# Patient Record
Sex: Female | Born: 1995 | Race: White | Hispanic: No | Marital: Married | Smoking: Current some day smoker
Health system: Southern US, Community
[De-identification: ages and names within clinical notes are randomized; demographics above are authoritative.]

## PROBLEM LIST (undated history)

## (undated) ENCOUNTER — Inpatient Hospital Stay (HOSPITAL_COMMUNITY): Payer: Self-pay

## (undated) DIAGNOSIS — S199XXA Unspecified injury of neck, initial encounter: Secondary | ICD-10-CM

## (undated) DIAGNOSIS — Z789 Other specified health status: Secondary | ICD-10-CM

## (undated) HISTORY — PX: WISDOM TOOTH EXTRACTION: SHX21

---

## 2019-05-22 ENCOUNTER — Encounter (HOSPITAL_BASED_OUTPATIENT_CLINIC_OR_DEPARTMENT_OTHER): Payer: Self-pay | Admitting: *Deleted

## 2019-05-22 ENCOUNTER — Emergency Department (HOSPITAL_BASED_OUTPATIENT_CLINIC_OR_DEPARTMENT_OTHER)
Admission: EM | Admit: 2019-05-22 | Discharge: 2019-05-22 | Disposition: A | Discharge status: Home / Self Care | Payer: Worker's Compensation | Attending: Emergency Medicine | Admitting: Emergency Medicine

## 2019-05-22 ENCOUNTER — Emergency Department (HOSPITAL_BASED_OUTPATIENT_CLINIC_OR_DEPARTMENT_OTHER): Payer: Worker's Compensation

## 2019-05-22 ENCOUNTER — Other Ambulatory Visit: Payer: Self-pay

## 2019-05-22 DIAGNOSIS — W540XXA Bitten by dog, initial encounter: Secondary | ICD-10-CM | POA: Insufficient documentation

## 2019-05-22 DIAGNOSIS — Z23 Encounter for immunization: Secondary | ICD-10-CM | POA: Diagnosis not present

## 2019-05-22 DIAGNOSIS — Y939 Activity, unspecified: Secondary | ICD-10-CM | POA: Insufficient documentation

## 2019-05-22 DIAGNOSIS — S6010XA Contusion of unspecified finger with damage to nail, initial encounter: Secondary | ICD-10-CM

## 2019-05-22 DIAGNOSIS — Y929 Unspecified place or not applicable: Secondary | ICD-10-CM | POA: Insufficient documentation

## 2019-05-22 DIAGNOSIS — Y999 Unspecified external cause status: Secondary | ICD-10-CM | POA: Diagnosis not present

## 2019-05-22 DIAGNOSIS — F17200 Nicotine dependence, unspecified, uncomplicated: Secondary | ICD-10-CM | POA: Diagnosis not present

## 2019-05-22 DIAGNOSIS — S60111A Contusion of right thumb with damage to nail, initial encounter: Secondary | ICD-10-CM | POA: Insufficient documentation

## 2019-05-22 DIAGNOSIS — S6991XA Unspecified injury of right wrist, hand and finger(s), initial encounter: Secondary | ICD-10-CM | POA: Diagnosis present

## 2019-05-22 MED ORDER — AMOXICILLIN-POT CLAVULANATE 875-125 MG PO TABS: 1.0000 | ORAL_TABLET | Freq: Two times a day (BID) | ORAL | 0 refills | Status: AC

## 2019-05-22 MED ORDER — ACETAMINOPHEN 325 MG PO TABS
650.0000 mg | ORAL_TABLET | Freq: Once | ORAL | Status: AC
  Administered 2019-05-22: 650 mg via ORAL
  Filled 2019-05-22: qty 2

## 2019-05-22 MED ORDER — HYDROCODONE-ACETAMINOPHEN 5-325 MG PO TABS: 1.0000 | ORAL_TABLET | Freq: Four times a day (QID) | ORAL | 0 refills | Status: DC | PRN

## 2019-05-22 MED ORDER — LIDOCAINE-EPINEPHRINE-TETRACAINE (LET) SOLUTION
3.0000 mL | Freq: Once | NASAL | Status: AC
  Administered 2019-05-22: 3 mL via TOPICAL
  Filled 2019-05-22: qty 3

## 2019-05-22 MED ORDER — TETANUS-DIPHTH-ACELL PERTUSSIS 5-2.5-18.5 LF-MCG/0.5 IM SUSP
0.5000 mL | Freq: Once | INTRAMUSCULAR | Status: AC
  Administered 2019-05-22: 0.5 mL via INTRAMUSCULAR
  Filled 2019-05-22: qty 0.5

## 2019-05-22 NOTE — ED Provider Notes (Signed)
[facility] EMERGENCY DEPARTMENT Provider Note   CSN: [id number] Arrival date & time: 05/22/19  1754    History   Chief Complaint Chief Complaint  Patient presents with  . Animal Bite    HPI Teresa Ball is a 23 y.o. female with no significant past medical history, who presents today for evaluation of a dog bite to her right thumb.  She is right-hand dominant.  She reports that the bite happened at about 4 PM.  She says that the dog was a [place] at work/doggy daycare that was being aggressive and when she did her neck and ended up biting her thumb.  She does report that she has a scratch on her neck however states that the dog did not bite or "get his mouth around" her neck.  She states that she has seen the dog's vaccination records and it is up-to-date on rabies vaccines.  She is unsure when her last tetanus shot was.  She has not eaten anything since lunch today.  No ibuprofen or Tylenol prior to arrival.  She reports that she is unable to bend the distal part of her right thumb.  She denies injuries to any other fingers.     HPI  History reviewed. No pertinent past medical history.  There are no active problems to display for this patient.   History reviewed. No pertinent surgical history.   OB History   No obstetric history on file.      Home Medications    Prior to Admission medications   Medication Sig Start Date End Date Taking? Authorizing Provider  amoxicillin-clavulanate (AUGMENTIN) 875-125 MG tablet Take 1 tablet by mouth every 12 (twelve) hours for 10 days. 05/22/19 06/01/19  Cristina Gong, PA-C  HYDROcodone-acetaminophen (NORCO/VICODIN) 5-325 MG tablet Take 1 tablet by mouth every 6 (six) hours as needed for severe pain. 05/22/19   Cristina Gong, PA-C    Family History No family history on file.  Social History Social History   Tobacco Use  . Smoking status: Current Some Day Smoker  . Smokeless tobacco: Never Used   Substance Use Topics  . Alcohol use: Yes  . Drug use: Never     Allergies   Patient has no known allergies.   Review of Systems Review of Systems  Constitutional: Negative for chills and fever.  Respiratory: Negative for cough and shortness of breath.   Musculoskeletal: Negative for back pain and neck pain.  Skin: Positive for wound.  Neurological: Negative for headaches.  Psychiatric/Behavioral: Negative for confusion.  All other systems reviewed and are negative.    Physical Exam Updated Vital Signs BP 117/63 (BP Location: Right Arm)   Pulse 63   Temp 99.2 F (37.3 C)   Resp 18   Ht 6' (1.829 m)   Wt 95.3 kg   LMP 04/30/2019   SpO2 100%   BMI 28.48 kg/m    Physical Exam Vitals signs and nursing note reviewed.  Constitutional:      General: She is not in acute distress.    Appearance: She is well-developed. She is not diaphoretic.  HENT:     Head: Normocephalic and atraumatic.     Mouth/Throat:     Mouth: Mucous membranes are moist.  Eyes:     General: No scleral icterus.       Right eye: No discharge.        Left eye: No discharge.     Conjunctiva/sclera: Conjunctivae normal.     Pupils:  Pupils are equal, round, and reactive to light.  Neck:     Musculoskeletal: Full passive range of motion without pain, normal range of motion and neck supple. No neck rigidity, injury, pain with movement or muscular tenderness.     Vascular: Normal carotid pulses. No JVD.     Trachea: Trachea and phonation normal. No tracheal tenderness.     Comments: Please see clinical image.  There is a very superficial abrasion present on the anterior neck.  There is no palpable subcutaneous emphysema.  No air escaping from the wound. Cardiovascular:     Rate and Rhythm: Normal rate and regular rhythm.     Pulses: Normal pulses.     Comments: Right thumb has brisk capillary refill to the distal nail. Pulmonary:     Effort: Pulmonary effort is normal. No respiratory distress.      Breath sounds: No stridor.  Abdominal:     General: There is no distension.  Musculoskeletal:        General: No deformity.  Skin:    General: Skin is warm and dry.     Comments: Please see clinical image.  Right thumb has a small subungual hematoma on the proximal aspect.  There are puncture wounds on the right thumb along the interphalangeal joint.  Puncture wounds are primarily on the dorsal aspect.  Neurological:     General: No focal deficit present.     Mental Status: She is alert. Mental status is at baseline.     Cranial Nerves: No cranial nerve deficit.     Motor: No abnormal muscle tone.  Psychiatric:        Mood and Affect: Mood normal.        Behavior: Behavior normal.              ED Treatments / Results  Labs (all labs ordered are listed, but only abnormal results are displayed) Labs Reviewed - No data to display  EKG None  Radiology Dg Hand Complete Right  Result Date: 05/22/2019 CLINICAL DATA:  Right thumb pain status post dog bite EXAM: RIGHT HAND - COMPLETE 3+ VIEW COMPARISON:  None. FINDINGS: There is no evidence of fracture or dislocation. There is no evidence of arthropathy or other focal bone abnormality. Soft tissues are unremarkable. IMPRESSION: No acute osseous injury of the right hand. Electronically Signed   By: Elige KoHetal  Patel   On: 05/22/2019 19:16    Procedures Procedures (including critical care time) .Nail Trephination  Date/Time: 05/23/2019 1:16 AM  Performed by: Cristina GongHammond, Camri Molloy W, PA-C  Authorized by: Cristina GongHammond, Gray Maugeri W, PA-C   Consent:    Consent obtained:  Verbal   Consent given by:  Patient   Risks discussed:  Bleeding, infection, pain and permanent nail deformity   Alternatives discussed:  No treatment and referral Location:    Hand:  R thumb Pre-procedure details:    Skin preparation:  Betadine   Preparation: Patient was prepped and draped in the usual sterile fashion   Trephination:    Subungual hematoma drained: yes      Trephination instrument:  Needle Post-procedure details:    Dressing:  Antibiotic ointment   Patient tolerance of procedure:  Tolerated well, no immediate complications    Medications Ordered in ED Medications  Tdap (BOOSTRIX) injection 0.5 mL (0.5 mLs Intramuscular Given 05/22/19 1913)  acetaminophen (TYLENOL) tablet 650 mg (650 mg Oral Given 05/22/19 1913)  lidocaine-EPINEPHrine-tetracaine (LET) solution (3 mLs Topical Given 05/22/19 2002)     Initial Impression /  Assessment and Plan / ED Course  I have reviewed the triage vital signs and the nursing notes.  Pertinent labs & imaging results that were available during my care of the patient were reviewed by me and considered in my medical decision making (see chart for details).       Patient presents with laceration from a dog bite.  Pt wounds irrigated well with  with sterile saline.  Wounds examined with visualization of the base and no foreign bodies seen, wound are superficial.  Pt Alert and oriented, NAD, nontoxic, nonseptic appearing.  Capillary refill intact and pt without neurologic deficit.  Right thumb x-rays with no fracture or abnormalities. Patient tetanus updated.   Pt reports dog is UTD on rabies vaccine .  Wounds are superficial, would not require closure.  I offered her drainage of the subungual hematoma and we discussed risks and benefits and elected for drainage.  Pain treated in the emergency department. Discharged home with prescription for Augmentin, instructions on OTC medicine for pain in addition to a short course of Vicodin as needed.  She does have a very superficial scratch on her anterior neck.  Do not suspect a penetrating neck wound as wound does not appear to extend into subcutaneous tissue.  This was a scratch and she did not have any compression of the neck.  Scratch is not over vessels, do not suspect dissection.    Hand follow up given along with splint.    Return precautions were discussed with  patient who states their understanding.  At the time of discharge patient denied any unaddressed complaints or concerns.  Patient is agreeable for discharge home.   Final Clinical Impressions(s) / ED Diagnoses   Final diagnoses:  Dog bite, initial encounter  Subungual hematoma of digit of hand, initial encounter    ED Discharge Orders         Ordered    amoxicillin-clavulanate (AUGMENTIN) 875-125 MG tablet  Every 12 hours     05/22/19 2047    HYDROcodone-acetaminophen (NORCO/VICODIN) 5-325 MG tablet  Every 6 hours PRN     05/22/19 2047          Cristina Gong, PA-C 05/23/19 0119  Melene Plan, DO 05/23/19 1516

## 2019-05-22 NOTE — ED Notes (Signed)
Patient transported to X-ray 

## 2019-05-22 NOTE — ED Triage Notes (Signed)
Pt bitten by dog on her right thumb while at work. She works for [employer] and was told by her supervisor she does not need drug screening

## 2019-05-22 NOTE — Discharge Instructions (Addendum)
Please take Ibuprofen (Advil, motrin) and Tylenol (acetaminophen) to relieve your pain.  You may take up to 600 MG (3 pills) of normal strength ibuprofen every 8 hours as needed.  In between doses of ibuprofen you make take tylenol, up to 1,000 mg (two extra strength pills).  Do not take more than 3,000 mg tylenol in a 24 hour period.  Please check all medication labels as many medications such as pain and cold medications may contain tylenol.  Do not drink alcohol while taking these medications.  Do not take other NSAID'S while taking ibuprofen (such as aleve or naproxen).  Please take ibuprofen with food to decrease stomach upset.  You may have diarrhea from the antibiotics.  It is very important that you continue to take the antibiotics even if you get diarrhea unless a medical professional tells you that you may stop taking them.  If you stop too early the bacteria you are being treated for will become stronger and you may need different, more powerful antibiotics that have more side effects and worsening diarrhea.  Please stay well hydrated and consider probiotics as they may decrease the severity of your diarrhea.  Please be aware that if you take any hormonal contraception (birth control pills, nexplanon, the ring, etc) that your birth control will not work while you are taking antibiotics and you need to use back up protection as directed on the birth control medication information insert.   You are being prescribed a medication which may make you sleepy. For 24 hours after one dose please do not drive, operate heavy machinery, care for a small child with out another adult present, or perform any activities that may cause harm to you or someone else if you were to fall asleep or be impaired.

## 2021-10-04 ENCOUNTER — Ambulatory Visit (HOSPITAL_BASED_OUTPATIENT_CLINIC_OR_DEPARTMENT_OTHER): Admission: RE | Admit: 2021-10-04 | Payer: 59 | Source: Ambulatory Visit | Admitting: Radiology

## 2021-10-04 ENCOUNTER — Ambulatory Visit (HOSPITAL_BASED_OUTPATIENT_CLINIC_OR_DEPARTMENT_OTHER)
Admission: RE | Admit: 2021-10-04 | Discharge: 2021-10-04 | Disposition: A | Discharge status: Home / Self Care | Payer: 59 | Source: Ambulatory Visit | Attending: Emergency Medicine | Admitting: Emergency Medicine

## 2021-10-04 ENCOUNTER — Encounter (HOSPITAL_BASED_OUTPATIENT_CLINIC_OR_DEPARTMENT_OTHER): Payer: Self-pay

## 2021-10-04 ENCOUNTER — Other Ambulatory Visit: Payer: Self-pay

## 2021-10-04 ENCOUNTER — Ambulatory Visit
Admission: EM | Admit: 2021-10-04 | Discharge: 2021-10-04 | Disposition: A | Discharge status: Home / Self Care | Payer: 59

## 2021-10-04 DIAGNOSIS — S79921D Unspecified injury of right thigh, subsequent encounter: Secondary | ICD-10-CM

## 2021-10-04 DIAGNOSIS — S79912S Unspecified injury of left hip, sequela: Secondary | ICD-10-CM

## 2021-10-04 DIAGNOSIS — M25551 Pain in right hip: Secondary | ICD-10-CM | POA: Diagnosis not present

## 2021-10-04 DIAGNOSIS — S79911D Unspecified injury of right hip, subsequent encounter: Secondary | ICD-10-CM | POA: Diagnosis not present

## 2021-10-04 DIAGNOSIS — S79922S Unspecified injury of left thigh, sequela: Secondary | ICD-10-CM

## 2021-10-04 DIAGNOSIS — M25552 Pain in left hip: Secondary | ICD-10-CM | POA: Diagnosis not present

## 2021-10-04 HISTORY — DX: Unspecified injury of neck, initial encounter: S19.9XXA

## 2021-10-04 LAB — POCT URINE PREGNANCY: Preg Test, Ur: NEGATIVE

## 2021-10-04 MED ORDER — KETOROLAC TROMETHAMINE 60 MG/2ML IM SOLN
60.0000 mg | Freq: Once | INTRAMUSCULAR | Status: AC
  Administered 2021-10-04: 60 mg via INTRAMUSCULAR

## 2021-10-04 MED ORDER — METHYLPREDNISOLONE 4 MG PO TBPK: ORAL_TABLET | ORAL | 0 refills | Status: DC

## 2021-10-04 NOTE — Discharge Instructions (Addendum)
Please go to the med center [facility]pp[facility] location to obtain x-ray of your left hip.  You were provided with an injection of ketorolac 60 mg in the clinic and a prescription for Medrol Dosepak for your pain.  Ribs and should be ready in about an hour.  Once your x-ray results are complete I will be notified and I will contact you to let you know if there is anything that needs to be acutely addressed.  The urine pregnancy test we performed today is a requirement by the radiology department.

## 2021-10-04 NOTE — ED Provider Notes (Signed)
[facility]    CSN: [id number] Arrival date & time: 10/04/21  1329      History   Chief Complaint Chief Complaint  Patient presents with   Hip Pain    HPI Teresa Ball is a 25 y.o. female.   Patient states that when she was younger she used to ride horses competitively and had several falls and injuries.  States that at 33 she also had a significant injury to her left hip from a fall but did not seek medical treatment at the time, instead had a friend who is a physical therapist help her work through it and she does feel that she got better.  Patient states that for the past several weeks she has noticed increasing pain in her right hip joint that radiates to her right knee down to her right ankle.  Patient states she is concerned that she may have early onset arthritis and would like to have imaging performed.  The history is provided by the patient.  Hip Pain This is a new problem. The current episode started more than 1 week ago. The problem occurs constantly. The problem has been gradually worsening. The symptoms are aggravated by bending, exertion, twisting and standing. The symptoms are relieved by NSAIDs. Treatments tried: NSAIDs. The treatment provided mild relief.   Past Medical History:  Diagnosis Date   Neck injury     There are no problems to display for this patient.   Past Surgical History:  Procedure Laterality Date   WISDOM TOOTH EXTRACTION      OB History   No obstetric history on file.      Home Medications    Prior to Admission medications   Medication Sig Start Date End Date Taking? Authorizing Provider  methylPREDNISolone (MEDROL DOSEPAK) 4 MG TBPK tablet Take 24 mg on day 1, 20 mg on day 2, 16 mg on day 3, 12 mg on day 4, 8 mg on day 5, 4 mg on day 6. 10/04/21  Yes Theadora Rama Scales, PA-C  HYDROcodone-acetaminophen (NORCO/VICODIN) 5-325 MG tablet Take 1 tablet by mouth every 6 (six) hours as needed for severe pain. 05/22/19   Cristina Gong, PA-C  sertraline (ZOLOFT) 100 MG tablet Take 100 mg by mouth daily. 05/01/21   [provider]    Family History No family history on file.  Social History Social History   Tobacco Use   Smoking status: Some Days   Smokeless tobacco: Never  Vaping Use   Vaping Use: Former  Substance Use Topics   Alcohol use: Yes   Drug use: Never     Allergies   Patient has no known allergies.   Review of Systems Review of Systems Pertinent findings noted in history of present illness.    Physical Exam Triage Vital Signs ED Triage Vitals  Enc Vitals Group     BP 10/04/21 1437 114/77     Pulse Rate 10/04/21 1437 (!) 103     Resp 10/04/21 1437 18     Temp 10/04/21 1437 98.2 F (36.8 C)     Temp Source 10/04/21 1437 Oral     SpO2 10/04/21 1437 98 %     Weight 10/04/21 1437 250 lb (113.4 kg)     Height --      Head Circumference --      Peak Flow --      Pain Score 10/04/21 1425 8     Pain Loc --  Pain Edu? --      Excl. in GC? --    No data found.  Updated Vital Signs BP 114/77 (BP Location: Right Arm)   Pulse (!) 103   Temp 98.2 F (36.8 C) (Oral)   Resp 18   Wt 250 lb (113.4 kg)   LMP 10/02/2021   SpO2 98%   BMI 33.91 kg/m   Visual Acuity Right Eye Distance:   Left Eye Distance:   Bilateral Distance:    Right Eye Near:   Left Eye Near:    Bilateral Near:     Physical Exam Vitals and nursing note reviewed.  Constitutional:      Appearance: Normal appearance. She is obese.  HENT:     Head: Normocephalic and atraumatic.  Cardiovascular:     Rate and Rhythm: Normal rate and regular rhythm.     Pulses: Normal pulses.     Heart sounds: Normal heart sounds.  Pulmonary:     Effort: Pulmonary effort is normal.     Breath sounds: Normal breath sounds.  Musculoskeletal:        General: Tenderness present. Normal range of motion.     Cervical back: Normal range of motion and neck supple.  Skin:    General: Skin is warm and dry.   Neurological:     General: No focal deficit present.     Mental Status: She is alert and oriented to person, place, and time. Mental status is at baseline.  Psychiatric:        Mood and Affect: Mood normal.        Behavior: Behavior normal.     UC Treatments / Results  Labs (all labs ordered are listed, but only abnormal results are displayed) Labs Reviewed  POCT URINE PREGNANCY    EKG   Radiology No results found.  Procedures Procedures (including critical care time)  Medications Ordered in UC Medications  ketorolac (TORADOL) injection 60 mg (60 mg Intramuscular Given 10/04/21 1526)    Initial Impression / Assessment and Plan / UC Course  I have reviewed the triage vital signs and the nursing notes.  Pertinent labs & imaging results that were available during my care of the patient were reviewed by me and considered in my medical decision making (see chart for details).     Imaging of right hip has been ordered, [facility].  Patient has been prescribed Medrol Dosepak and provided with an injection of ketorolac today for pain relief.  We will formulate a plan of care based on the results of her imaging.Patient verbalized understanding and agreement of plan as discussed.  All questions were addressed during visit.  Please see discharge instructions below for further details of plan.  Final Clinical Impressions(s) / UC Diagnoses   Final diagnoses:  Injury to hip and thigh, right, subsequent encounter  Right hip pain     Discharge Instructions      Please go to the med center droppage location to obtain x-ray of your left hip.  You were provided with an injection of ketorolac 60 mg in the clinic and a prescription for Medrol Dosepak for your pain.  Ribs and should be ready in about an hour.  Once your x-ray results are complete I will be notified and I will contact you to let you know if there is anything  that needs to be acutely addressed.  The urine pregnancy test we performed today is  a requirement by the radiology department.     ED Prescriptions     Medication Sig Dispense Auth. Provider   methylPREDNISolone (MEDROL DOSEPAK) 4 MG TBPK tablet Take 24 mg on day 1, 20 mg on day 2, 16 mg on day 3, 12 mg on day 4, 8 mg on day 5, 4 mg on day 6. 21 tablet Theadora Rama Scales, PA-C      PDMP not reviewed this encounter.   Theadora Rama Scales, PA-C 10/04/21 1656

## 2021-10-04 NOTE — ED Triage Notes (Signed)
Patient reports having right hip pain that is radiating to left and ankle for about 4 days.   Patient is ambulating with a limp.

## 2022-08-30 ENCOUNTER — Ambulatory Visit (INDEPENDENT_AMBULATORY_CARE_PROVIDER_SITE_OTHER): Payer: 59

## 2022-08-30 ENCOUNTER — Ambulatory Visit
Admission: RE | Admit: 2022-08-30 | Discharge: 2022-08-30 | Disposition: A | Discharge status: Home / Self Care | Payer: 59 | Source: Ambulatory Visit

## 2022-08-30 VITALS — BP 124/85 | HR 89 | Temp 98.0°F | Resp 16

## 2022-08-30 DIAGNOSIS — M5416 Radiculopathy, lumbar region: Secondary | ICD-10-CM | POA: Diagnosis not present

## 2022-08-30 DIAGNOSIS — M5441 Lumbago with sciatica, right side: Secondary | ICD-10-CM

## 2022-08-30 DIAGNOSIS — M545 Low back pain, unspecified: Secondary | ICD-10-CM | POA: Diagnosis not present

## 2022-08-30 DIAGNOSIS — Z87828 Personal history of other (healed) physical injury and trauma: Secondary | ICD-10-CM

## 2022-08-30 DIAGNOSIS — Z8739 Personal history of other diseases of the musculoskeletal system and connective tissue: Secondary | ICD-10-CM | POA: Diagnosis not present

## 2022-08-30 MED ORDER — TIZANIDINE HCL 4 MG PO TABS: 4.0000 mg | ORAL_TABLET | Freq: Every day | ORAL | 0 refills | Status: DC

## 2022-08-30 MED ORDER — TRIAMCINOLONE ACETONIDE 40 MG/ML IJ SUSP
40.0000 mg | Freq: Once | INTRAMUSCULAR | Status: AC
  Administered 2022-08-30: 40 mg via INTRAMUSCULAR

## 2022-08-30 NOTE — ED Triage Notes (Signed)
Pt. States she has excruciating back pain that started Friday. After visiting [facility] Monday she was given a back brace and steroids to take. Pt. States nothing has helped. Pt. Hx of a hernia in her back d/t a neck and spinal injury a few years ago.

## 2022-08-30 NOTE — ED Provider Notes (Signed)
[facility]  Note:  This document was prepared using Conservation officer, historic buildings and may include unintentional dictation errors.  MRN: [id number] DOB: [birth year]  Subjective:   Teresa Ball is a 26 y.o. female presenting for 1 week history of persistent severe 10 out of 10 low back pain that can radiate down into the buttock/hip area.  Feels like the sensation in the right leg is slightly different from the left.  She does have a history of a significant neck injury and had to wear a brace for months.  She also has a history of a herniated bulging disc of the low back.  Does not have a back specialist. No fall, trauma, saddle paresthesia, changes to bowel or urinary habits, radicular symptoms.  She did go to a different clinic and was given a back brace, prescribed a prednisone Dosepak and methocarbamol.  She is also taken amoxicillin for an infection.   No current facility-administered medications for this encounter.  Current Outpatient Medications:    amoxicillin (AMOXIL) 875 MG tablet, Take 875 mg by mouth 2 (two) times daily., Disp: , Rfl:    methocarbamol (ROBAXIN) 500 MG tablet, Take 500 mg by mouth 4 (four) times daily., Disp: , Rfl:    predniSONE (DELTASONE) 10 MG tablet, Take 10 mg by mouth 6 (six) times daily. Medication is being tapered down daily, Disp: , Rfl:    HYDROcodone-acetaminophen (NORCO/VICODIN) 5-325 MG tablet, Take 1 tablet by mouth every 6 (six) hours as needed for severe pain., Disp: 4 tablet, Rfl: 0   methylPREDNISolone (MEDROL DOSEPAK) 4 MG TBPK tablet, Take 24 mg on day 1, 20 mg on day 2, 16 mg on day 3, 12 mg on day 4, 8 mg on day 5, 4 mg on day 6., Disp: 21 tablet, Rfl: 0   sertraline (ZOLOFT) 100 MG tablet, Take 100 mg by mouth daily., Disp: , Rfl:    No Known Allergies  Past Medical History:  Diagnosis Date   Neck injury      Past Surgical History:  Procedure Laterality Date   WISDOM TOOTH EXTRACTION      History  reviewed. No pertinent family history.  Social History   Tobacco Use   Smoking status: Some Days   Smokeless tobacco: Never  Vaping Use   Vaping Use: Former  Substance Use Topics   Alcohol use: Yes   Drug use: Never    ROS   Objective:   Vitals: BP 124/85   Pulse 89   Temp 98 F (36.7 C)   Resp 16   LMP 08/26/2022   SpO2 96%   Physical Exam Constitutional:      General: She is not in acute distress.    Appearance: Normal appearance. She is well-developed. She is not ill-appearing, toxic-appearing or diaphoretic.  HENT:     Head: Normocephalic and atraumatic.     Nose: Nose normal.     Mouth/Throat:     Mouth: Mucous membranes are moist.  Eyes:     General: No scleral icterus.       Right eye: No discharge.        Left eye: No discharge.     Extraocular Movements: Extraocular movements intact.  Cardiovascular:     Rate and Rhythm: Normal rate.  Pulmonary:     Effort: Pulmonary effort is normal.  Musculoskeletal:     Lumbar back: Tenderness (over area outlined) present. No swelling, edema, deformity, signs of trauma, lacerations, spasms or bony tenderness.  Normal range of motion. Positive right straight leg raise test. Negative left straight leg raise test. No scoliosis.       Back:  Skin:    General: Skin is warm and dry.  Neurological:     General: No focal deficit present.     Mental Status: She is alert and oriented to person, place, and time.     Motor: No weakness.     Coordination: Coordination normal.     Gait: Gait normal.     Deep Tendon Reflexes: Reflexes normal.  Psychiatric:        Mood and Affect: Mood normal.        Behavior: Behavior normal.        Thought Content: Thought content normal.        Judgment: Judgment normal.    DG Lumbar Spine Complete  Result Date: 08/30/2022 CLINICAL DATA:  Low back pain. EXAM: LUMBAR SPINE - COMPLETE 4+ VIEW COMPARISON:  None Available. FINDINGS: There are 5 non-rib-bearing lumbar vertebra. Slight levo  scoliotic curvature of the upper lumbar spine. Otherwise normal alignment. Normal vertebral body heights. Normal intervertebral disc spaces. No evidence of fracture, pars defects or focal bone abnormality. The sacroiliac joints are congruent. IMPRESSION: Slight levo scoliotic curvature of the upper lumbar spine may be scoliosis or due to muscle spasm. Otherwise unremarkable radiographic appearance of the lumbar spine. Electronically Signed   By: Narda Rutherford M.D.   On: 08/30/2022 17:12    Assessment and Plan :   PDMP not reviewed this encounter.  1. Lumbar radiculopathy   2. History of herniated intervertebral disc   3. Acute bilateral low back pain with right-sided sciatica   4. History of spinal cord injury   5. History of neck injury    IM triamcinolone 40 mg in clinic.  Recommended continued use of her prednisone Dosepak, stop Robaxin and start tizanidine.  Follow-up with the spine specialty clinic. Counseled patient on potential for adverse effects with medications prescribed/recommended today, ER and return-to-clinic precautions discussed, patient verbalized understanding.    Wallis Bamberg, PA-C 08/30/22 1807

## 2022-12-31 IMAGING — DX DG HIP (WITH OR WITHOUT PELVIS) 2-3V*R*
3 series · 3 of 3 positions shown · non-contrast
Comparison: None.

CLINICAL DATA: Right-sided hip pain

EXAM:
DG HIP (WITH OR WITHOUT PELVIS) 2-3V RIGHT

[pelvis ap]
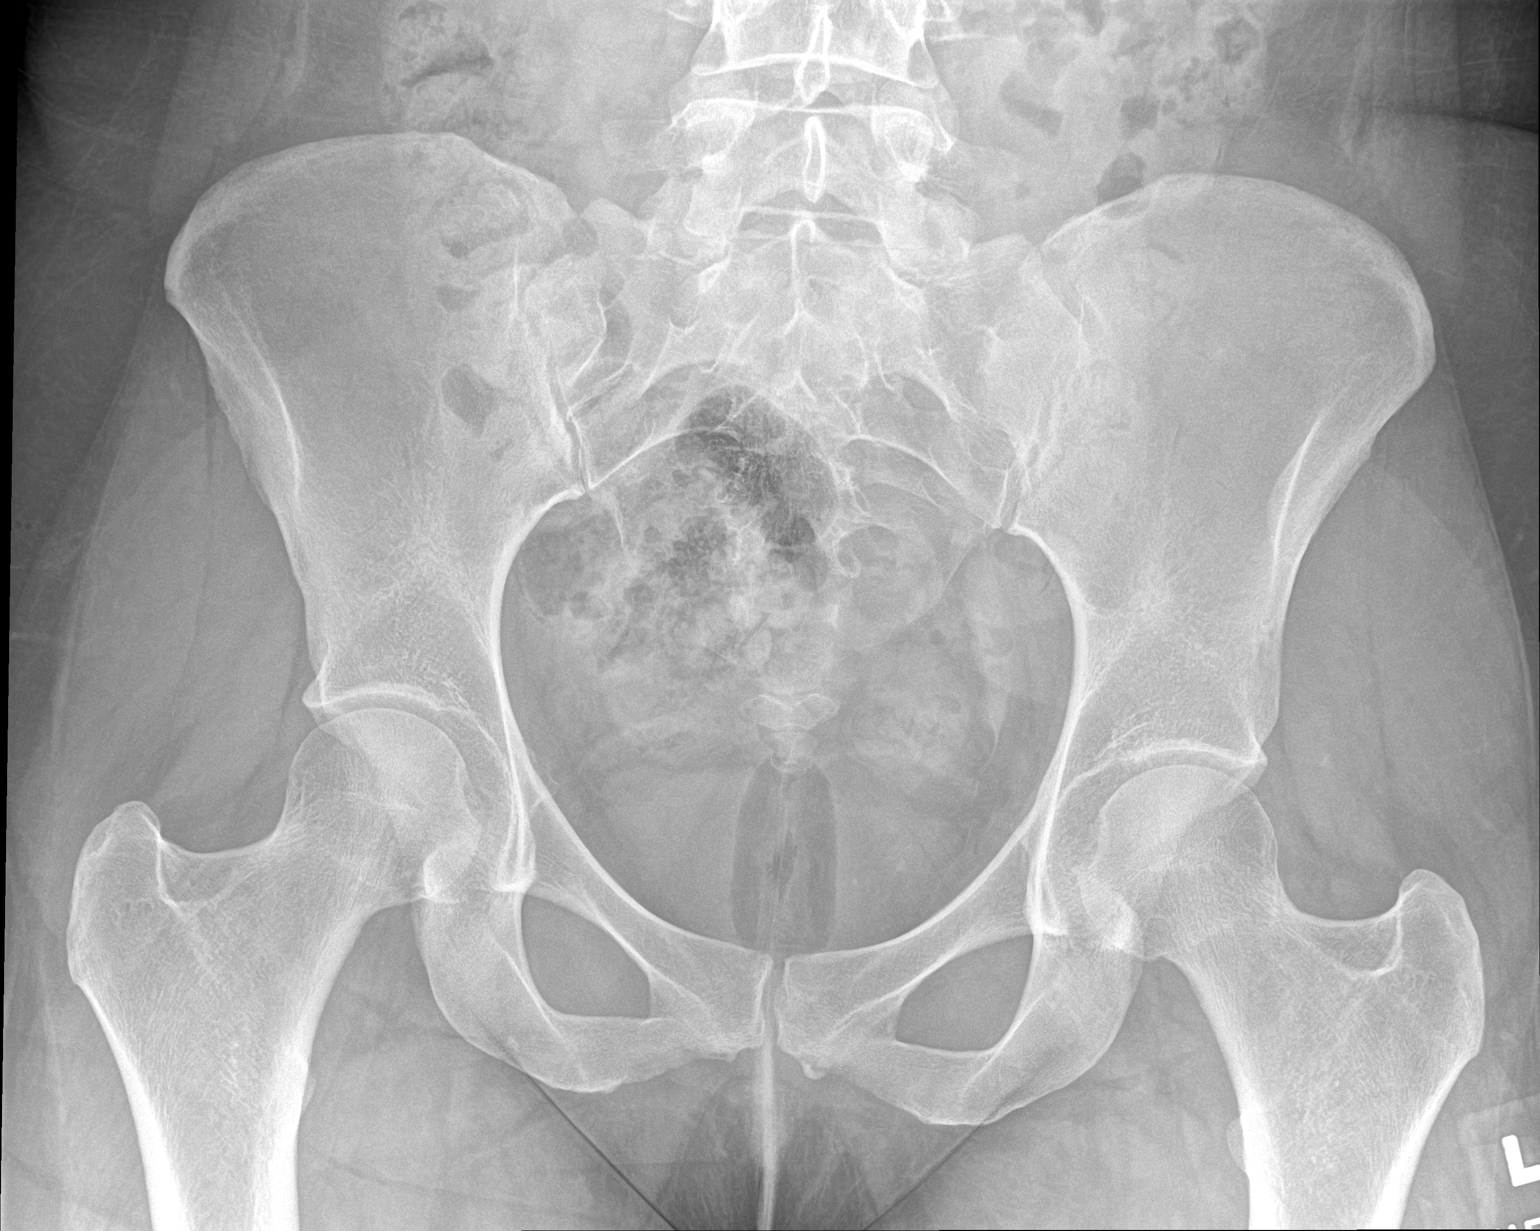

[hip ap]
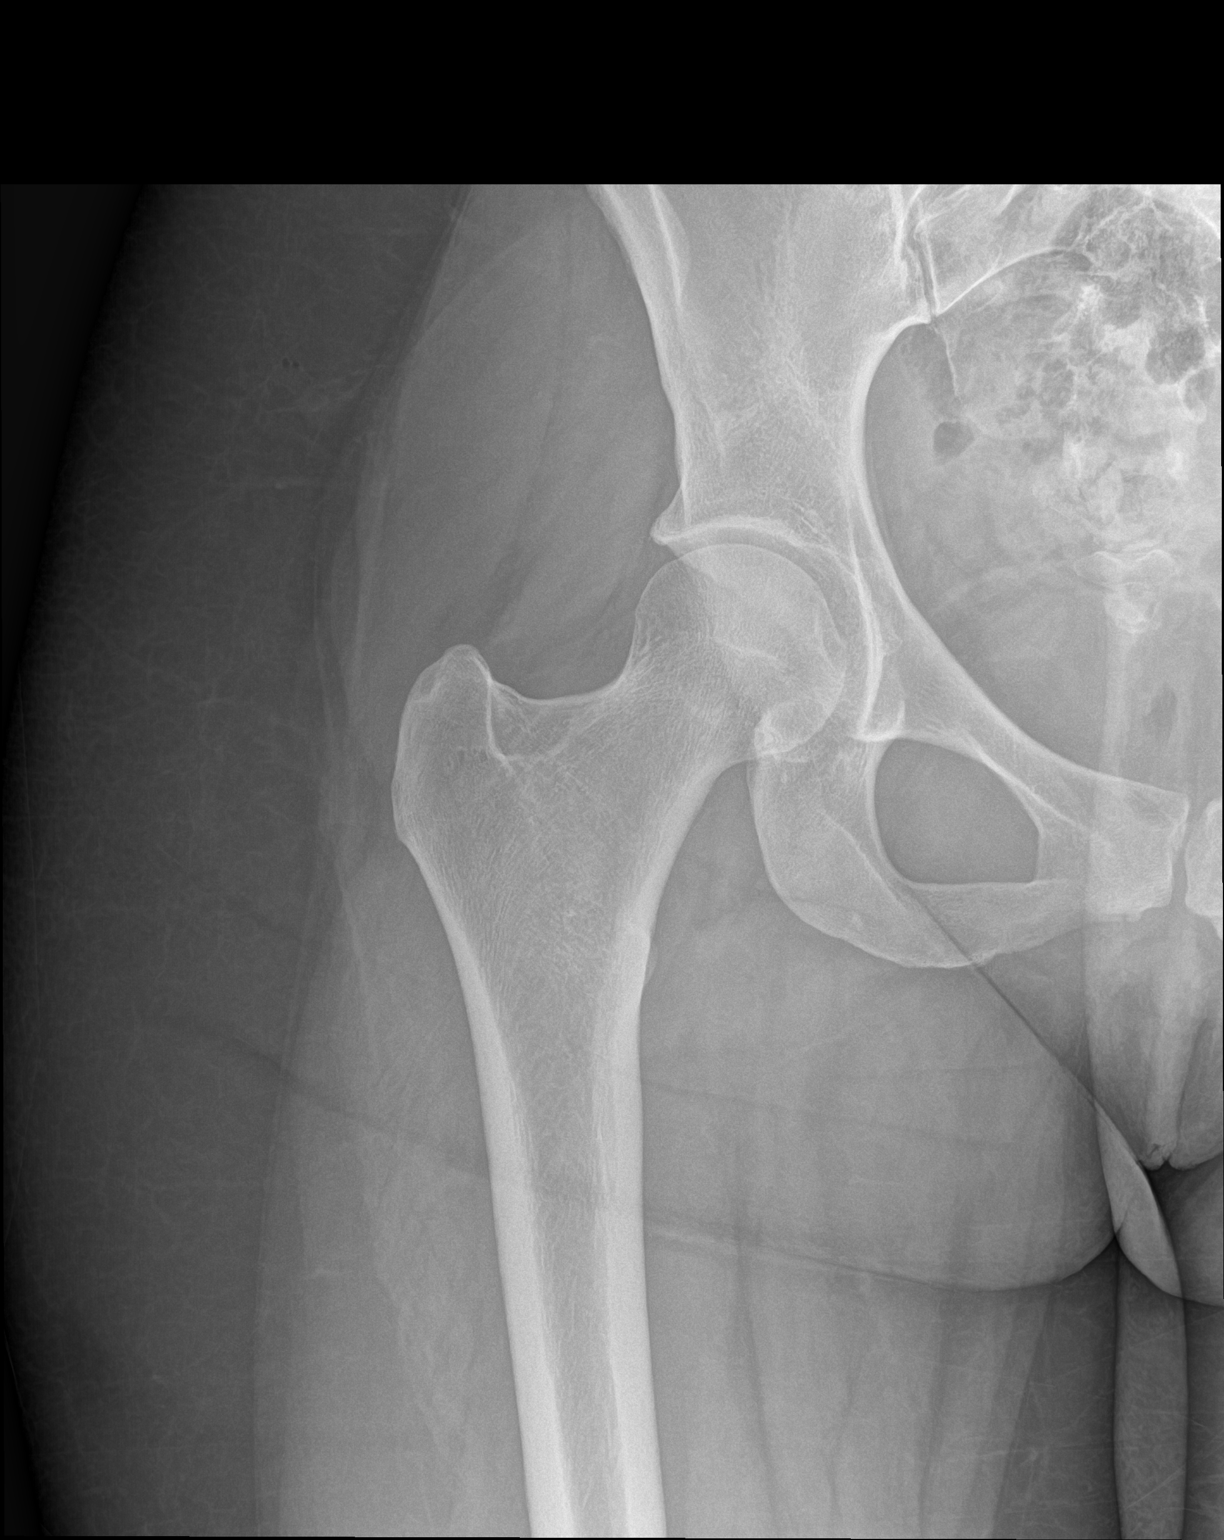

[hip lat]
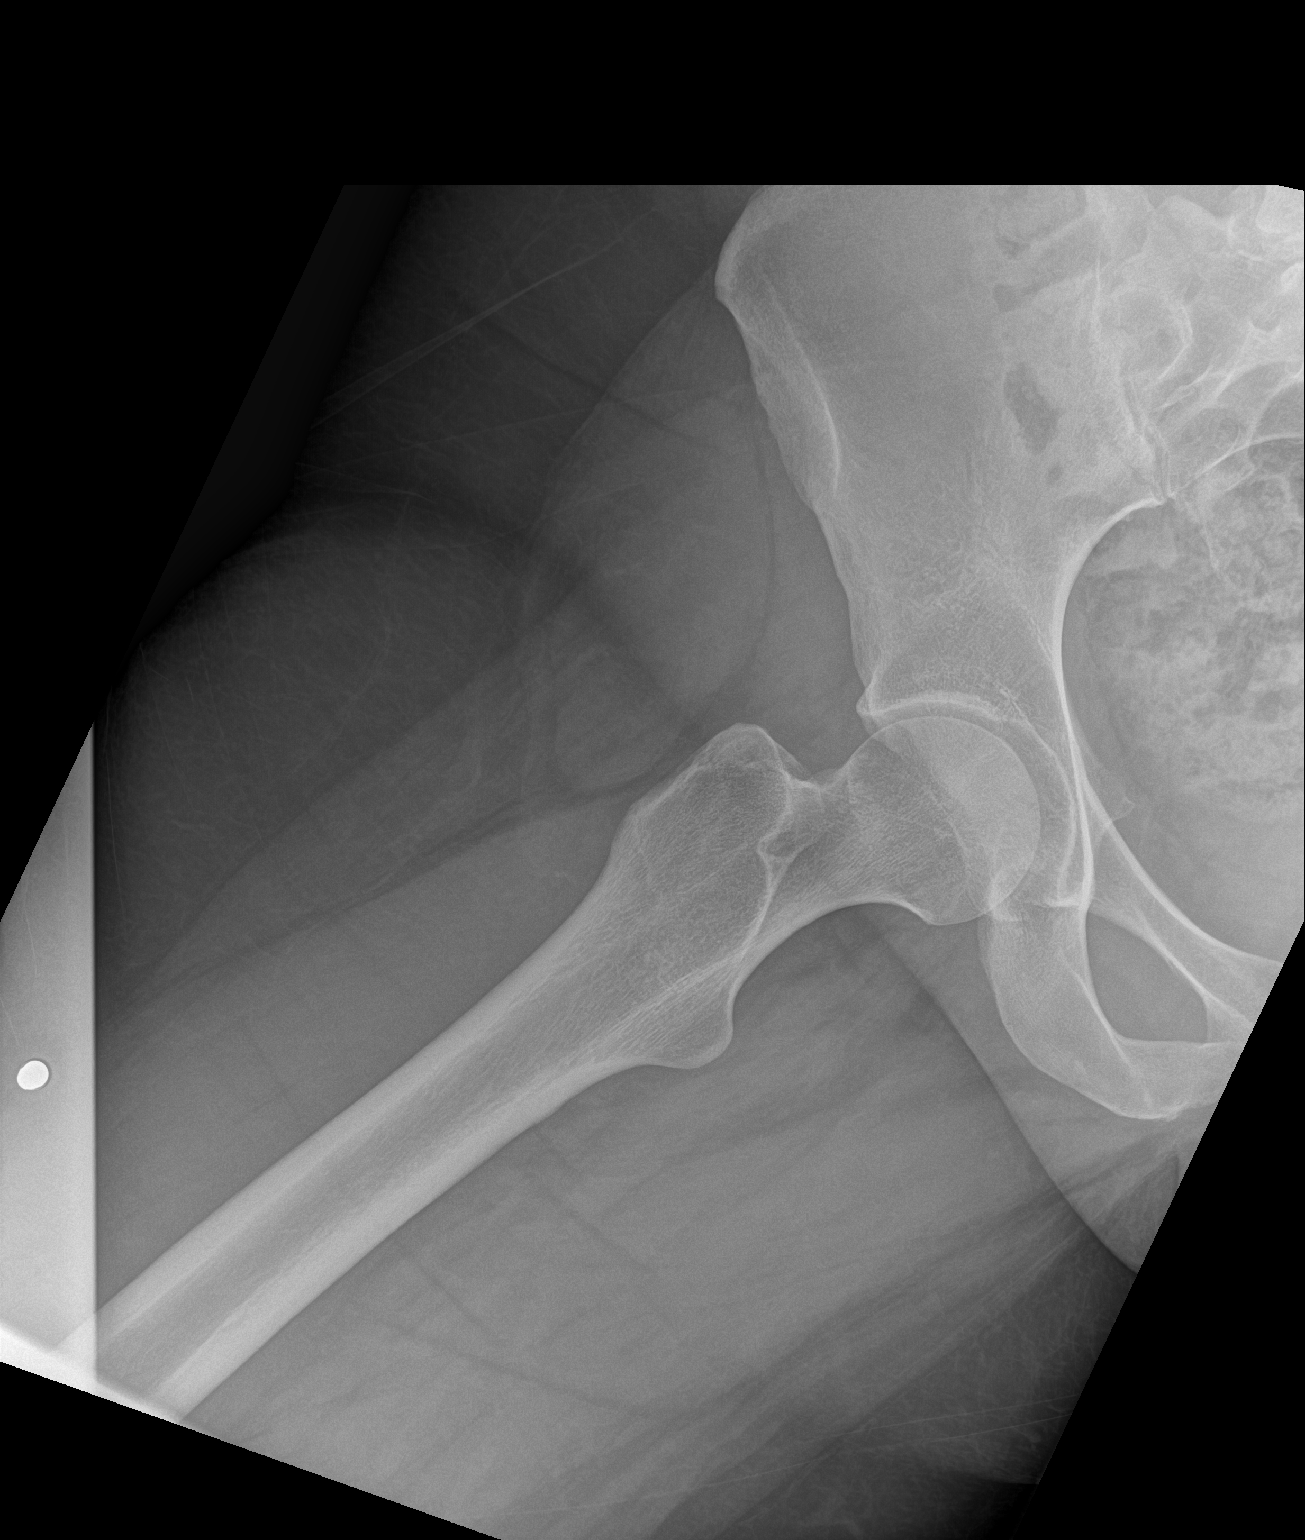

[3 of 3 positions shown; findings below may reference images not displayed]

FINDINGS: There is no evidence of hip fracture or dislocation. There is no
evidence of arthropathy or other focal bone abnormality.
IMPRESSION: Negative.

## 2023-01-07 ENCOUNTER — Ambulatory Visit
Admission: EM | Admit: 2023-01-07 | Discharge: 2023-01-07 | Disposition: A | Discharge status: Home / Self Care | Payer: 59 | Attending: Emergency Medicine | Admitting: Emergency Medicine

## 2023-01-07 DIAGNOSIS — J329 Chronic sinusitis, unspecified: Secondary | ICD-10-CM

## 2023-01-07 DIAGNOSIS — Z789 Other specified health status: Secondary | ICD-10-CM

## 2023-01-07 DIAGNOSIS — Z3202 Encounter for pregnancy test, result negative: Secondary | ICD-10-CM | POA: Diagnosis not present

## 2023-01-07 DIAGNOSIS — R519 Headache, unspecified: Secondary | ICD-10-CM

## 2023-01-07 LAB — POCT URINALYSIS DIP (MANUAL ENTRY)
Bilirubin, UA: NEGATIVE
Glucose, UA: NEGATIVE mg/dL
Leukocytes, UA: NEGATIVE
Nitrite, UA: NEGATIVE
Protein Ur, POC: NEGATIVE mg/dL
Spec Grav, UA: 1.01 (ref 1.010–1.025)
Urobilinogen, UA: 0.2 E.U./dL
pH, UA: 6.5 (ref 5.0–8.0)

## 2023-01-07 LAB — POCT URINE PREGNANCY: Preg Test, Ur: NEGATIVE

## 2023-01-07 MED ORDER — CETIRIZINE HCL 10 MG PO TABS: 10.0000 mg | ORAL_TABLET | Freq: Every day | ORAL | 1 refills | Status: AC

## 2023-01-07 MED ORDER — IBUPROFEN 600 MG PO TABS: 600.0000 mg | ORAL_TABLET | Freq: Three times a day (TID) | ORAL | 0 refills | Status: AC | PRN

## 2023-01-07 MED ORDER — IBUPROFEN 800 MG PO TABS
800.0000 mg | ORAL_TABLET | Freq: Once | ORAL | Status: AC
  Administered 2023-01-07: 800 mg via ORAL

## 2023-01-07 MED ORDER — FLUTICASONE PROPIONATE 50 MCG/ACT NA SUSP: 1.0000 | Freq: Every day | NASAL | 2 refills | Status: AC

## 2023-01-07 NOTE — ED Triage Notes (Addendum)
Pt c/o SOB, dizziness, headache, neck pain, and nausea. Pt denies active chest pain.   Started: today  Home intervention: tylenol (last taken about 1.5 hrs ago)   The patient is A&Ox4, speech is clear and pt is ambulatory.

## 2023-01-07 NOTE — ED Notes (Signed)
Pt in restroom 

## 2023-01-07 NOTE — ED Provider Notes (Signed)
[facility]    CSN: [id number] Arrival date & time: 01/07/23  1133    HISTORY   Chief Complaint  Patient presents with   Headache   Dizziness   Nausea   Neck Pain   HPI Teresa Ball is a pleasant, 27 y.o. female who presents to urgent care today. Patient complains of neck pain that radiates to the back of her head, nausea, dizziness and feeling short of breath.  Patient denies chest pain.  Patient has normal vital signs on arrival today.  Patient states she took Tylenol approximately 1-1/2 hours prior to arrival.  Patient has normal vital signs on arrival today and appears to be in no acute distress.  Patient is requesting pregnancy test due to also feeling more fatigued lately.  The history is provided by the patient.   Past Medical History:  Diagnosis Date   Neck injury    There are no problems to display for this patient.  Past Surgical History:  Procedure Laterality Date   WISDOM TOOTH EXTRACTION     OB History   No obstetric history on file.    Home Medications    Prior to Admission medications   Medication Sig Start Date End Date Taking? Authorizing Provider  amoxicillin (AMOXIL) 875 MG tablet Take 875 mg by mouth 2 (two) times daily.    [provider]  HYDROcodone-acetaminophen (NORCO/VICODIN) 5-325 MG tablet Take 1 tablet by mouth every 6 (six) hours as needed for severe pain. 05/22/19   Cristina GongHammond, Elizabeth W, PA-C  methylPREDNISolone (MEDROL DOSEPAK) 4 MG TBPK tablet Take 24 mg on day 1, 20 mg on day 2, 16 mg on day 3, 12 mg on day 4, 8 mg on day 5, 4 mg on day 6. 10/04/21   Theadora RamaMorgan, Will Heinkel Scales, PA-C  predniSONE (DELTASONE) 10 MG tablet Take 10 mg by mouth 6 (six) times daily. Medication is being tapered down daily    [provider]  sertraline (ZOLOFT) 100 MG tablet Take 100 mg by mouth daily. 05/01/21   [provider]  tiZANidine (ZANAFLEX) 4 MG tablet Take 1 tablet (4 mg total) by mouth at bedtime. 08/30/22   Wallis BambergMani, Mario,  PA-C    Family History History reviewed. No pertinent family history. Social History Social History   Tobacco Use   Smoking status: Some Days   Smokeless tobacco: Never  Vaping Use   Vaping Use: Former  Substance Use Topics   Alcohol use: Yes   Drug use: Never   Allergies   Patient has no known allergies.  Review of Systems Review of Systems Pertinent findings revealed after performing a 14 point review of systems has been noted in the history of present illness.  Physical Exam Triage Vital Signs ED Triage Vitals  Enc Vitals Group     BP 10/26/21 0827 (!) 147/82     Pulse Rate 10/26/21 0827 72     Resp 10/26/21 0827 18     Temp 10/26/21 0827 98.3 F (36.8 C)     Temp Source 10/26/21 0827 Oral     SpO2 10/26/21 0827 98 %     Weight --      Height --      Head Circumference --      Peak Flow --      Pain Score 10/26/21 0826 5     Pain Loc --      Pain Edu? --      Excl. in GC? --   No data  found.  Updated Vital Signs BP 121/76 (BP Location: Left Arm)   Pulse 94   Temp 98 F (36.7 C) (Oral)   Resp 18   LMP 12/14/2022   SpO2 97%   Physical Exam Vitals and nursing note reviewed.  Constitutional:      General: She is not in acute distress.    Appearance: Normal appearance. She is not ill-appearing.  HENT:     Head: Normocephalic and atraumatic.     Salivary Glands: Right salivary gland is not diffusely enlarged or tender. Left salivary gland is not diffusely enlarged or tender.     Right Ear: Ear canal and external ear normal. No drainage. A middle ear effusion is present. There is no impacted cerumen. Tympanic membrane is bulging. Tympanic membrane is not injected or erythematous.     Left Ear: Ear canal and external ear normal. No drainage. A middle ear effusion is present. There is no impacted cerumen. Tympanic membrane is bulging. Tympanic membrane is not injected or erythematous.     Ears:     Comments: Bilateral EACs normal, both TMs bulging with clear  fluid    Nose: Rhinorrhea present. No nasal deformity, septal deviation, signs of injury, nasal tenderness, mucosal edema or congestion. Rhinorrhea is clear.     Right Nostril: Occlusion present. No foreign body, epistaxis or septal hematoma.     Left Nostril: Occlusion present. No foreign body, epistaxis or septal hematoma.     Right Turbinates: Enlarged, swollen and pale.     Left Turbinates: Enlarged, swollen and pale.     Right Sinus: No maxillary sinus tenderness or frontal sinus tenderness.     Left Sinus: No maxillary sinus tenderness or frontal sinus tenderness.     Mouth/Throat:     Lips: Pink. No lesions.     Mouth: Mucous membranes are moist. No oral lesions.     Pharynx: Oropharynx is clear. Uvula midline. No posterior oropharyngeal erythema or uvula swelling.     Tonsils: No tonsillar exudate. 0 on the right. 0 on the left.     Comments: Postnasal drip Eyes:     General: Lids are normal.        Right eye: No discharge.        Left eye: No discharge.     Extraocular Movements: Extraocular movements intact.     Conjunctiva/sclera: Conjunctivae normal.     Right eye: Right conjunctiva is not injected.     Left eye: Left conjunctiva is not injected.  Neck:     Trachea: Trachea and phonation normal.  Cardiovascular:     Rate and Rhythm: Normal rate and regular rhythm.     Pulses: Normal pulses.     Heart sounds: Normal heart sounds. No murmur heard.    No friction rub. No gallop.  Pulmonary:     Effort: Pulmonary effort is normal. No accessory muscle usage, prolonged expiration or respiratory distress.     Breath sounds: Normal breath sounds. No stridor, decreased air movement or transmitted upper airway sounds. No decreased breath sounds, wheezing, rhonchi or rales.  Chest:     Chest Rasmussen: No tenderness.  Musculoskeletal:        General: Normal range of motion.     Cervical back: Normal range of motion and neck supple. Normal range of motion.  Lymphadenopathy:      Cervical: No cervical adenopathy.  Skin:    General: Skin is warm and dry.     Findings: No erythema or rash.  Neurological:     General: No focal deficit present.     Mental Status: She is alert and oriented to person, place, and time.  Psychiatric:        Mood and Affect: Mood normal.        Behavior: Behavior normal.     Visual Acuity Right Eye Distance:   Left Eye Distance:   Bilateral Distance:    Right Eye Near:   Left Eye Near:    Bilateral Near:     UC Couse / Diagnostics / Procedures:     Radiology No results found.  Procedures Procedures (including critical care time) EKG  Pending results:  Labs Reviewed  POCT URINALYSIS DIP (MANUAL ENTRY) - Abnormal; Notable for the following components:      Result Value   Ketones, POC UA moderate (40) (*)    Blood, UA trace-intact (*)    All other components within normal limits  POCT URINE PREGNANCY    Medications Ordered in UC: Medications  ibuprofen (ADVIL) tablet 800 mg (800 mg Oral Given 01/07/23 1250)    UC Diagnoses / Final Clinical Impressions(s)   I have reviewed the triage vital signs and the nursing notes.  Pertinent labs & imaging results that were available during my care of the patient were reviewed by me and considered in my medical decision making (see chart for details).    Final diagnoses:  Rhinosinusitis  Acute nonintractable headache, unspecified headache type  Not currently pregnant   The test today is negative.  Urine dip was unremarkable.  Patient's headache had improved before end of visit after taking ibuprofen 800 mg.  Will provide patient with a prescription and allergy medications based on physical exam findings concerning for allergies.  Return precautions advised.  Please see discharge instructions below for details of plan of care as provided to patient. ED Prescriptions     Medication Sig Dispense Auth. Provider   ibuprofen (ADVIL) 600 MG tablet Take 1 tablet (600 mg total) by  mouth every 8 (eight) hours as needed for up to 30 doses for fever, headache, mild pain or moderate pain (Inflammation). Take 1 tablet 3 times daily as needed for inflammation of upper airways and/or pain. 30 tablet Theadora RamaMorgan, Niambi Smoak Scales, PA-C   cetirizine (ZYRTEC ALLERGY) 10 MG tablet Take 1 tablet (10 mg total) by mouth at bedtime. 90 tablet Theadora RamaMorgan, Zoeie Ritter Scales, PA-C   fluticasone (FLONASE) 50 MCG/ACT nasal spray Place 1 spray into both nostrils daily. Begin by using 2 sprays in each nare daily for 3 to 5 days, then decrease to 1 spray in each nare daily. 15.8 mL Theadora RamaMorgan, Sherley Mckenney Scales, PA-C      PDMP not reviewed this encounter.  Pending results:  Labs Reviewed  POCT URINALYSIS DIP (MANUAL ENTRY) - Abnormal; Notable for the following components:      Result Value   Ketones, POC UA moderate (40) (*)    Blood, UA trace-intact (*)    All other components within normal limits  POCT URINE PREGNANCY    Discharge Instructions:   Discharge Instructions      Please continue ibuprofen every 8 hours as needed for headache.  Please begin allergy medications as prescribed, Zyrtec antihistamine tablet and Flonase as a nasal steroid.  Taken together, they should significantly relieve allergy symptoms and help you avoid another similar headache.    Thank you for visiting urgent care today.      Disposition Upon Discharge:  Condition: stable for discharge home  Patient  presented with an acute illness with associated systemic symptoms and significant discomfort requiring urgent management. In my opinion, this is a condition that a prudent lay person (someone who possesses an average knowledge of health and medicine) may potentially expect to result in complications if not addressed urgently such as respiratory distress, impairment of bodily function or dysfunction of bodily organs.   Routine symptom specific, illness specific and/or disease specific instructions were discussed with the  patient and/or caregiver at length.   As such, the patient has been evaluated and assessed, work-up was performed and treatment was provided in alignment with urgent care protocols and evidence based medicine.  Patient/parent/caregiver has been advised that the patient may require follow up for further testing and treatment if the symptoms continue in spite of treatment, as clinically indicated and appropriate.  Patient/parent/caregiver has been advised to return to the [facility] or PCP if no better; to PCP or the Emergency Department if new signs and symptoms develop, or if the current signs or symptoms continue to change or worsen for further workup, evaluation and treatment as clinically indicated and appropriate  The patient will follow up with their current PCP if and as advised. If the patient does not currently have a PCP we will assist them in obtaining one.   The patient may need specialty follow up if the symptoms continue, in spite of conservative treatment and management, for further workup, evaluation, consultation and treatment as clinically indicated and appropriate.  Patient/parent/caregiver verbalized understanding and agreement of plan as discussed.  All questions were addressed during visit.  Please see discharge instructions below for further details of plan.  This office note has been dictated using Teaching laboratory technician.  Unfortunately, this method of dictation can sometimes lead to typographical or grammatical errors.  I apologize for your inconvenience in advance if this occurs.  Please do not hesitate to reach out to me if clarification is needed.      Theadora Rama Scales, [place] 01/07/23 1333

## 2023-01-07 NOTE — Discharge Instructions (Signed)
Please continue ibuprofen every 8 hours as needed for headache.  Please begin allergy medications as prescribed, Zyrtec antihistamine tablet and Flonase as a nasal steroid.  Taken together, they should significantly relieve allergy symptoms and help you avoid another similar headache.    Thank you for visiting urgent care today.

## 2023-06-20 ENCOUNTER — Other Ambulatory Visit: Payer: Self-pay | Admitting: Obstetrics and Gynecology

## 2023-06-20 DIAGNOSIS — N6310 Unspecified lump in the right breast, unspecified quadrant: Secondary | ICD-10-CM

## 2023-07-11 ENCOUNTER — Ambulatory Visit: Admission: RE | Admit: 2023-07-11 | Payer: 59 | Source: Ambulatory Visit

## 2023-07-11 DIAGNOSIS — N6310 Unspecified lump in the right breast, unspecified quadrant: Secondary | ICD-10-CM

## 2025-01-10 ENCOUNTER — Inpatient Hospital Stay (HOSPITAL_COMMUNITY)
Admission: AD | Admit: 2025-01-10 | Discharge: 2025-01-10 | Disposition: A | Discharge status: Home / Self Care | Attending: Obstetrics and Gynecology | Admitting: Obstetrics and Gynecology

## 2025-01-10 ENCOUNTER — Other Ambulatory Visit: Payer: Self-pay

## 2025-01-10 ENCOUNTER — Encounter (HOSPITAL_COMMUNITY): Payer: Self-pay | Admitting: Obstetrics and Gynecology

## 2025-01-10 DIAGNOSIS — Z3A23 23 weeks gestation of pregnancy: Secondary | ICD-10-CM | POA: Diagnosis not present

## 2025-01-10 DIAGNOSIS — O26892 Other specified pregnancy related conditions, second trimester: Secondary | ICD-10-CM | POA: Diagnosis not present

## 2025-01-10 DIAGNOSIS — R109 Unspecified abdominal pain: Secondary | ICD-10-CM | POA: Diagnosis not present

## 2025-01-10 DIAGNOSIS — O26899 Other specified pregnancy related conditions, unspecified trimester: Secondary | ICD-10-CM

## 2025-01-10 DIAGNOSIS — O4702 False labor before 37 completed weeks of gestation, second trimester: Secondary | ICD-10-CM | POA: Diagnosis present

## 2025-01-10 HISTORY — DX: Other specified health status: Z78.9

## 2025-01-10 LAB — URINALYSIS, ROUTINE W REFLEX MICROSCOPIC
Bilirubin Urine: NEGATIVE
Glucose, UA: NEGATIVE mg/dL
Hgb urine dipstick: NEGATIVE
Ketones, ur: NEGATIVE mg/dL
Nitrite: NEGATIVE
Protein, ur: NEGATIVE mg/dL
Specific Gravity, Urine: 1.014 (ref 1.005–1.030)
pH: 6 (ref 5.0–8.0)

## 2025-01-10 MED ORDER — ACETAMINOPHEN 500 MG PO TABS
1000.0000 mg | ORAL_TABLET | Freq: Once | ORAL | Status: AC
  Administered 2025-01-10: 1000 mg via ORAL
  Filled 2025-01-10: qty 2

## 2025-01-10 NOTE — MAU Provider Note (Signed)
 " History     CSN: [id number]  Arrival date and time: 01/10/25 1040   Event Date/Time   First Provider Initiated Contact with Patient 01/10/25 1112      Chief Complaint  Patient presents with   Contractions   HPI Teresa Ball is a 29 y.o. G1P0 at [redacted]w[redacted]d who presents via ems for contractions. Symptoms started this morning while at work. Reports feeling sharp abdominal pain every 5 minutes.  Denies n/v/d, dysuria, vaginal bleeding, lof, or vaginal discharge. No recent intercourse. Denies complications with pregnancy. Hasn't had anything to eat or drink yet today.  +FM  OB History     Gravida  1   Para      Term      Preterm      AB      Living         SAB      IAB      Ectopic      Multiple      Live Births              Past Medical History:  Diagnosis Date   Medical history non-contributory    Neck injury     Past Surgical History:  Procedure Laterality Date   WISDOM TOOTH EXTRACTION      Family History  Problem Relation Age of Onset   Diabetes Mother    Diabetes Maternal Aunt     Social History[1]  Allergies: Allergies[2]  No medications prior to admission.    Review of Systems  All other systems reviewed and are negative.  Physical Exam   Blood pressure 118/65, pulse 90, temperature 98.4 F (36.9 C), resp. rate 16, height 6' 1 (1.854 m), weight (!) 137.9 kg, SpO2 98%.  Physical Exam Vitals and nursing note reviewed. Exam conducted with a chaperone present.  Constitutional:      General: She is not in acute distress.    Appearance: Normal appearance. She is not ill-appearing.  HENT:     Head: Normocephalic and atraumatic.  Eyes:     General: No scleral icterus.    Pupils: Pupils are equal, round, and reactive to light.  Pulmonary:     Effort: Pulmonary effort is normal. No respiratory distress.  Abdominal:     Tenderness: There is no abdominal tenderness.     Comments: gravid  Genitourinary:    Comments: Dilation:  Closed Effacement (%): Thick Cervical Position: Posterior Exam by:: Rocky Koh, NP  Skin:    General: Skin is warm and dry.  Neurological:     Mental Status: She is alert.    NST:  Baseline: 145 bpm, Variability: Good {> 6 bpm), Accelerations: Non-reactive but appropriate for gestational age, and Decelerations: Absent  MAU Course  Procedures Results for orders placed or performed during the hospital encounter of 01/10/25 (from the past 24 hours)  Urinalysis, Routine w reflex microscopic -Urine, Clean Catch     Status: Abnormal   Collection Time: 01/10/25 11:10 AM  Result Value Ref Range   Color, Urine AMBER (A) YELLOW   APPearance HAZY (A) CLEAR   Specific Gravity, Urine 1.014 1.005 - 1.030   pH 6.0 5.0 - 8.0   Glucose, UA NEGATIVE NEGATIVE mg/dL   Hgb urine dipstick NEGATIVE NEGATIVE   Bilirubin Urine NEGATIVE NEGATIVE   Ketones, ur NEGATIVE NEGATIVE mg/dL   Protein, ur NEGATIVE NEGATIVE mg/dL   Nitrite NEGATIVE NEGATIVE   Leukocytes,Ua LARGE (A) NEGATIVE   RBC / HPF 0-5 0 - 5  RBC/hpf   WBC, UA 0-5 0 - 5 WBC/hpf   Bacteria, UA RARE (A) NONE SEEN   Squamous Epithelial / HPF 0-5 0 - 5 /HPF   WBC Clumps PRESENT    Mucus PRESENT     MDM   Assessment and Plan   1. Abdominal cramping affecting pregnancy   2. [redacted] weeks gestation of pregnancy    -Abdomen soft/nontender. Toco quiet. Cervix visually & digitally closed. Cervix remains closed after 2 hours of monitoring. Symptoms improved with tylenol  & oral hydration -U/a with large leuks. Denies urinary complaints. Urine culture pending -Reviewed PTL precautions & reasons to return to MAU  Rocky Satterfield 01/10/2025, 9:14 PM      [1]  Social History Tobacco Use   Smoking status: Former    Types: Cigarettes   Smokeless tobacco: Never  Vaping Use   Vaping status: Former  Substance Use Topics   Alcohol use: Not Currently   Drug use: Never  [2] No Known Allergies  "

## 2025-01-10 NOTE — MAU Note (Signed)
 Teresa Ball is a 29 y.o. at Unknown here in MAU reporting: Pt noticed cxs at 0750 at work; Arrived via EMS; Denies VB/ROM; Reports fetal movement   Onset of complaint: 0750 Pain score: 7/10 Vitals:   01/10/25 1056  BP: 129/71  Pulse: (!) 119  Resp: 18  Temp: 98.4 F (36.9 C)  SpO2: 99%     FHT: 150 Lab orders placed from triage: UA

## 2025-01-11 LAB — CULTURE, OB URINE: Special Requests: NORMAL

## 2025-01-21 ENCOUNTER — Other Ambulatory Visit: Payer: Self-pay | Admitting: Obstetrics and Gynecology

## 2025-01-21 DIAGNOSIS — Z3689 Encounter for other specified antenatal screening: Secondary | ICD-10-CM

## 2025-02-24 ENCOUNTER — Ambulatory Visit

## 2025-02-25 ENCOUNTER — Ambulatory Visit
